# Patient Record
Sex: Female | Born: 1965 | Hispanic: Yes | Marital: Married | Smoking: Never smoker
Health system: Southern US, Community
[De-identification: ages and names within clinical notes are randomized; demographics above are authoritative.]

## PROBLEM LIST (undated history)

## (undated) DIAGNOSIS — T8859XA Other complications of anesthesia, initial encounter: Secondary | ICD-10-CM

## (undated) DIAGNOSIS — K219 Gastro-esophageal reflux disease without esophagitis: Secondary | ICD-10-CM

## (undated) DIAGNOSIS — N83209 Unspecified ovarian cyst, unspecified side: Secondary | ICD-10-CM

## (undated) HISTORY — PX: TUBAL LIGATION: SHX77

## (undated) HISTORY — PX: OTHER SURGICAL HISTORY: SHX169

---

## 2014-04-20 ENCOUNTER — Ambulatory Visit: Payer: Self-pay | Admitting: Internal Medicine

## 2015-11-18 ENCOUNTER — Other Ambulatory Visit: Payer: Self-pay | Admitting: Internal Medicine

## 2015-11-18 DIAGNOSIS — Z1231 Encounter for screening mammogram for malignant neoplasm of breast: Secondary | ICD-10-CM

## 2015-11-27 ENCOUNTER — Emergency Department: Payer: BLUE CROSS/BLUE SHIELD

## 2015-11-27 ENCOUNTER — Encounter: Payer: Self-pay | Admitting: Emergency Medicine

## 2015-11-27 ENCOUNTER — Emergency Department
Admission: EM | Admit: 2015-11-27 | Discharge: 2015-11-27 | Disposition: A | Discharge status: Home / Self Care | Payer: BLUE CROSS/BLUE SHIELD | Attending: Student | Admitting: Student

## 2015-11-27 DIAGNOSIS — R58 Hemorrhage, not elsewhere classified: Secondary | ICD-10-CM

## 2015-11-27 DIAGNOSIS — N390 Urinary tract infection, site not specified: Secondary | ICD-10-CM | POA: Diagnosis not present

## 2015-11-27 DIAGNOSIS — N939 Abnormal uterine and vaginal bleeding, unspecified: Secondary | ICD-10-CM | POA: Diagnosis not present

## 2015-11-27 DIAGNOSIS — R1030 Lower abdominal pain, unspecified: Secondary | ICD-10-CM | POA: Diagnosis present

## 2015-11-27 HISTORY — DX: Unspecified ovarian cyst, unspecified side: N83.209

## 2015-11-27 LAB — CBC WITH DIFFERENTIAL/PLATELET
Basophils Absolute: 0 10*3/uL (ref 0–0.1)
Basophils Relative: 1 %
Eosinophils Absolute: 0.1 10*3/uL (ref 0–0.7)
Eosinophils Relative: 2 %
HEMATOCRIT: 40.3 % (ref 35.0–47.0)
HEMOGLOBIN: 14.2 g/dL (ref 12.0–16.0)
LYMPHS ABS: 1.1 10*3/uL (ref 1.0–3.6)
LYMPHS PCT: 25 %
MCH: 30.8 pg (ref 26.0–34.0)
MCHC: 35.1 g/dL (ref 32.0–36.0)
MCV: 87.6 fL (ref 80.0–100.0)
MONO ABS: 0.3 10*3/uL (ref 0.2–0.9)
MONOS PCT: 6 %
NEUTROS ABS: 2.9 10*3/uL (ref 1.4–6.5)
NEUTROS PCT: 68 %
Platelets: 293 10*3/uL (ref 150–440)
RBC: 4.6 MIL/uL (ref 3.80–5.20)
RDW: 13.4 % (ref 11.5–14.5)
WBC: 4.3 10*3/uL (ref 3.6–11.0)

## 2015-11-27 LAB — COMPREHENSIVE METABOLIC PANEL
ALT: 19 U/L (ref 14–54)
ANION GAP: 6 (ref 5–15)
AST: 22 U/L (ref 15–41)
Albumin: 4 g/dL (ref 3.5–5.0)
Alkaline Phosphatase: 80 U/L (ref 38–126)
BUN: 10 mg/dL (ref 6–20)
CALCIUM: 8.9 mg/dL (ref 8.9–10.3)
CHLORIDE: 105 mmol/L (ref 101–111)
CO2: 26 mmol/L (ref 22–32)
Creatinine, Ser: 0.64 mg/dL (ref 0.44–1.00)
GFR calc non Af Amer: >60 mL/min (ref >60)
Glucose, Bld: 104 mg/dL — ABNORMAL HIGH (ref 65–99)
Potassium: 3.7 mmol/L (ref 3.5–5.1)
SODIUM: 137 mmol/L (ref 135–145)
Total Bilirubin: 0.4 mg/dL (ref 0.3–1.2)
Total Protein: 7.4 g/dL (ref 6.5–8.1)

## 2015-11-27 LAB — URINALYSIS COMPLETE WITH MICROSCOPIC (ARMC ONLY)
BACTERIA UA: NONE SEEN
Specific Gravity, Urine: 1.02 (ref 1.005–1.030)

## 2015-11-27 LAB — WET PREP, GENITAL
CLUE CELLS WET PREP: NONE SEEN
SPERM: NONE SEEN
TRICH WET PREP: NONE SEEN
Yeast Wet Prep HPF POC: NONE SEEN

## 2015-11-27 LAB — POCT PREGNANCY, URINE: PREG TEST UR: NEGATIVE

## 2015-11-27 MED ORDER — NITROFURANTOIN MONOHYD MACRO 100 MG PO CAPS: 100.0000 mg | ORAL_CAPSULE | Freq: Two times a day (BID) | ORAL | 0 refills | Status: AC

## 2015-11-27 MED ORDER — NITROFURANTOIN MONOHYD MACRO 100 MG PO CAPS
100.0000 mg | ORAL_CAPSULE | Freq: Once | ORAL | Status: AC
  Administered 2015-11-27: 100 mg via ORAL
  Filled 2015-11-27: qty 1

## 2015-11-27 NOTE — ED Notes (Signed)
Pt states that she started with bleeding yesterday, states that she had not had a menstral cycle since march of 2016, states some lower abd discomfort and also states some difficulty and pain with urination, pt states that she is uncertain if the blood is coming from the vagina or bladder. 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 States that she takes vitamins and b12 due to anemia, no distress noted, pt resting comfortably and given a warm blanket, pt oriented to the call bell and when to use, pt's family at bedside, cont to monitor

## 2015-11-27 NOTE — ED Provider Notes (Signed)
[facility] Emergency Department Provider Note   ____________________________________________   First MD Initiated Contact with Patient 11/27/15 [phone]     (approximate)  I have reviewed the triage vital signs and the nursing notes.   HISTORY  Chief Complaint Vaginal Bleeding  Caveat - language barrier overcome with use of the Spanish interpreter  HPI Renee Benson is a 50 y.o. 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 Patient reports that this feels like a typical menstrual. However she has not had a menstrual cycle since March 2016. She last had intercourse 4 days ago but reports she did not have bleeding until yesterday. She Is having some dysuria. No vomiting, diarrhea, fevers or chills. She is having some lower abdominal cramping which feels like a menstrual cramp. She denies chest pain or difficulty breathing.   Past Medical History:  Diagnosis Date  . Ovarian cyst     There are no active problems to display for this patient.   Past Surgical History:  Procedure Laterality Date  . nephrectomy      Prior to Admission medications   Medication Sig Start Date End Date Taking? Authorizing Provider  nitrofurantoin, macrocrystal-monohydrate, (MACROBID) 100 MG capsule Take 1 capsule (100 mg total) by mouth 2 (two) times daily. 11/27/15 11/30/15  Gayla Doss, MD    Allergies Review of patient's allergies indicates no known allergies.  History reviewed. No pertinent family history.  Social History Social History  Substance Use Topics  . Smoking status: Never Smoker  . Smokeless tobacco: Never Used  . Alcohol use No    Review of Systems Constitutional: No fever/chills Eyes: No visual changes. ENT: No sore throat. Cardiovascular: Denies chest pain. Respiratory:  Denies shortness of breath. Gastrointestinal: +lower abdominal cramping.  No nausea, no vomiting.  No diarrhea.  No constipation. Genitourinary: Negative for dysuria. Musculoskeletal: Negative for back pain. Skin: Negative for rash. Neurological: Negative for headaches, focal weakness or numbness.  10-point ROS otherwise negative.  ____________________________________________   PHYSICAL EXAM:   VITAL SIGNS: ED Triage Vitals  Enc Vitals Group     BP 11/27/15 0650 133/77     Pulse Rate 11/27/15 0650 66     Resp 11/27/15 0650 16     Temp 11/27/15 0650 98.1 F (36.7 C)     Temp Source 11/27/15 0650 Oral     SpO2 11/27/15 0650 100 %     Weight 11/27/15 0651 167 lb 8.8 oz (76 kg)     Height 11/27/15 0651 5' (1.524 m)     Head Circumference --      Peak Flow --      Pain Score 11/27/15 0652 4     Pain Loc --      Pain Edu? --      Excl. in GC? --     Constitutional: Alert and oriented. Well appearing and in no acute distress. Eyes: Conjunctivae are normal. PERRL. EOMI. Head: Atraumatic. Nose: No congestion/rhinnorhea. Mouth/Throat: Mucous membranes are moist.  Oropharynx non-erythematous. Neck: No stridor.  Cardiovascular: Normal rate, regular rhythm. Grossly normal heart sounds.  Good peripheral circulation. Respiratory: Normal respiratory effort.  No retractions. Lungs CTAB. Gastrointestinal: Soft and nontender. No distention. No CVA tenderness. Pelvic: Small amount of light vaginal bleeding from closed os, no vaginal laceration. Musculoskeletal: No lower extremity tenderness nor edema.  No joint  effusions. Neurologic:  Normal speech and language. No gross focal neurologic deficits are appreciated. No gait instability. Skin:  Skin is warm, dry and intact. No rash noted. Psychiatric: Mood and affect are normal. Speech and behavior are normal.  ____________________________________________   LABS (all labs ordered are listed, but only abnormal results are  displayed)  Labs Reviewed  WET PREP, GENITAL - Abnormal; Notable for the following:       Result Value   WBC, Wet Prep HPF POC FEW (*)    All other components within normal limits  URINALYSIS COMPLETEWITH MICROSCOPIC ([facility] ONLY) - Abnormal; Notable for the following:    Color, Urine RED (*)    APPearance CLOUDY (*)    Glucose, UA   (*)    Value: TEST NOT REPORTED DUE TO COLOR INTERFERENCE OF URINE PIGMENT   Bilirubin Urine   (*)    Value: TEST NOT REPORTED DUE TO COLOR INTERFERENCE OF URINE PIGMENT   Ketones, ur   (*)    Value: TEST NOT REPORTED DUE TO COLOR INTERFERENCE OF URINE PIGMENT   Hgb urine dipstick   (*)    Value: TEST NOT REPORTED DUE TO COLOR INTERFERENCE OF URINE PIGMENT   Protein, ur   (*)    Value: TEST NOT REPORTED DUE TO COLOR INTERFERENCE OF URINE PIGMENT   Nitrite   (*)    Value: TEST NOT REPORTED DUE TO COLOR INTERFERENCE OF URINE PIGMENT   Leukocytes, UA   (*)    Value: TEST NOT REPORTED DUE TO COLOR INTERFERENCE OF URINE PIGMENT   Squamous Epithelial / LPF 6-30 (*)    All other components within normal limits  COMPREHENSIVE METABOLIC PANEL - Abnormal; Notable for the following:    Glucose, Bld 104 (*)    All other components within normal limits  URINE CULTURE  CBC WITH DIFFERENTIAL/PLATELET  POCT PREGNANCY, URINE   ____________________________________________  EKG  none ____________________________________________  RADIOLOGY  Pelvic ultrasound IMPRESSION:  Endometrium is mildly thickened measuring 7 mm. In the setting of  post-menopausal bleeding, endometrial sampling is indicated to  exclude carcinoma. If results are benign, sonohysterogram should be  considered for focal lesion work-up. (Ref: Radiological Reasoning:  Algorithmic Workup of Abnormal Vaginal Bleeding with Endovaginal  Sonography and Sonohysterography. AJR 2008; 478:G95-62)    1.8 cm cyst within the left ovary. Recommend follow-up ultrasound in  12 months to assess for  resolution or stability.      ____________________________________________   PROCEDURES  Procedure(s) performed: None  Procedures  Critical Care performed: No  ____________________________________________   INITIAL IMPRESSION / ASSESSMENT AND PLAN / ED COURSE  Pertinent labs & imaging results that were available during my care of the patient were reviewed by me and considered in my medical decision making (see chart for details).  Juliah Scadden is a 50 y.o. 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 Since March 2016. On exam she is very well-appearing and in no acute distress. Vital signs stable, she is afebrile. She has a benign abdominal examination. Very small amount of light bleeding from a closed os, no vaginal laceration. We'll obtain screening labs as well as transitional ultrasound to evaluate the endometrial stripe though I discussed with her that she will require follow-up with OB/GYN expediently for additional testing which should include biopsy to  rule out cancer.  ----------------------------------------- 11:56 AM on 11/27/2015 ----------------------------------------- Patient continues to appear well. Labs are generally reassuring, unremarkable CBC and CMP. Negative pregnancy test. Negative wet prep. Urinalysis with 6-30 white blood cells but too numerous to count red blood cells which I suspect is contaminant from her vaginal bleeding. Culture is pending however given her complaint of dysuria, we will start Macrobid for possible UTI. I discussed with her with the  Spanish interpreter that she needs to have follow-up with an OB/GYN doctor in the next 2-3 days for repeat evaluation and for scheduled biopsy given ultrasound showing slightly thickened endometrial stripe. We  discussed return precautions and need for close follow-up and she is comfortable with the discharge plan. All questions answered to her satisfaction with the assistance of the [place] interpreter, Orson SlickJacqui.   Clinical Course     ____________________________________________   FINAL CLINICAL IMPRESSION(S) / ED DIAGNOSES  Final diagnoses:  Bleeding  Vaginal bleeding  UTI (lower urinary tract infection)      NEW MEDICATIONS STARTED DURING THIS VISIT:  Discharge Medication List as of 11/27/2015 12:00 PM    START taking these medications   Details  nitrofurantoin, macrocrystal-monohydrate, (MACROBID) 100 MG capsule Take 1 capsule (100 mg total) by mouth 2 (two) times daily., Starting Sat 11/27/2015, Until Tue 11/30/2015, Print         Note:  This document was prepared using Dragon voice recognition software and may include unintentional dictation errors.    Gayla DossEryka A Lyndel Sarate, MD 11/27/15 [phone]

## 2015-11-27 NOTE — ED Notes (Signed)
Pelvic exam performed by Dr Inocencio HomesGayle without difficulty, specimens obtained and sent to lab, pt tolerated well

## 2015-11-27 NOTE — ED Notes (Signed)
Pt changed into a gown, up to obtain clean catch urine. Report to david, rn.

## 2015-11-27 NOTE — ED Triage Notes (Signed)
Pt states vaginal bleeding, light in nature that began yesterday. Pt states she feels like she has menstral cramps. Pt states she last menstrated march 2016. Pt would like bleeding assessed. Pt states she also has pain with urination.

## 2015-11-28 LAB — URINE CULTURE

## 2015-11-29 NOTE — Progress Notes (Signed)
CONSULT NOTE - INITIAL  Pharmacy Consult for ED Culture Results   No Known Allergies  Patient Measurements: Height: 5' (152.4 cm) Weight: 167 lb 8.8 oz (76 kg) IBW/kg (Calculated) : 45.5  Recent Labs  11/27/15 0658  WBC 4.3  HGB 14.2  PLT 293  CREATININE 0.64   Estimated Creatinine Clearance: 75.8 mL/min (by C-G formula based on SCr of 0.8 mg/dL). No results for input(s): VANCOTROUGH, VANCOPEAK, VANCORANDOM, GENTTROUGH, GENTPEAK, GENTRANDOM, TOBRATROUGH, TOBRAPEAK, TOBRARND, AMIKACINPEAK, AMIKACINTROU, AMIKACIN in the last 72 hours.   Microbiology: Recent Results (from the past 720 hour(s))  Urine culture     Status: Abnormal   Collection Time: 11/27/15  6:58 AM  Result Value Ref Range Status   Specimen Description URINE, CLEAN CATCH  Final   Special Requests NONE  Final   Culture (A)  Final    >=100,000 COLONIES/mL STREPTOCOCCUS AGALACTIAE TESTING AGAINST S. AGALACTIAE NOT ROUTINELY PERFORMED DUE TO PREDICTABILITY OF AMP/PEN/VAN SUSCEPTIBILITY. Performed at [facility]    Report Status 11/28/2015 FINAL  Final  Wet prep, genital     Status: Abnormal   Collection Time: 11/27/15  7:48 AM  Result Value Ref Range Status   Yeast Wet Prep HPF POC NONE SEEN NONE SEEN Final   Trich, Wet Prep NONE SEEN NONE SEEN Final   Clue Cells Wet Prep HPF POC NONE SEEN NONE SEEN Final   WBC, Wet Prep HPF POC FEW (A) NONE SEEN Final   Sperm NONE SEEN  Final    Medical History: Past Medical History:  Diagnosis Date  . Ovarian cyst    Assessment: 50 yo female seen in ED for vaginal bleeding and dysuria. UA show no bacteria, 6-30 squamous epithelial cells, and 6-30 WBC.   Plan:  UA appears contaminated. UCx shows >100,000 Strep agalactiae.  Patient's PCP  ([facility]) was contacted on 9/11 @ 1415. Culture results were faxed over.  PCP  to f/u with patient.   Cher NakaiSheema Emric Kowalewski, PharmD Clinical Pharmacist 11/29/2015 2:26 PM

## 2015-12-16 ENCOUNTER — Ambulatory Visit: Payer: Self-pay | Attending: Internal Medicine

## 2016-06-08 ENCOUNTER — Ambulatory Visit: Payer: BLUE CROSS/BLUE SHIELD

## 2016-06-29 ENCOUNTER — Ambulatory Visit: Payer: BLUE CROSS/BLUE SHIELD | Attending: Internal Medicine

## 2016-11-16 ENCOUNTER — Other Ambulatory Visit: Payer: Self-pay | Admitting: Internal Medicine

## 2016-11-16 DIAGNOSIS — Z1231 Encounter for screening mammogram for malignant neoplasm of breast: Secondary | ICD-10-CM

## 2016-12-13 ENCOUNTER — Ambulatory Visit
Admission: RE | Admit: 2016-12-13 | Discharge: 2016-12-13 | Disposition: A | Discharge status: Home / Self Care | Payer: BLUE CROSS/BLUE SHIELD | Source: Ambulatory Visit | Attending: Internal Medicine | Admitting: Internal Medicine

## 2016-12-13 DIAGNOSIS — Z1231 Encounter for screening mammogram for malignant neoplasm of breast: Secondary | ICD-10-CM | POA: Insufficient documentation

## 2016-12-13 DIAGNOSIS — R921 Mammographic calcification found on diagnostic imaging of breast: Secondary | ICD-10-CM | POA: Diagnosis not present

## 2016-12-15 ENCOUNTER — Other Ambulatory Visit: Payer: Self-pay | Admitting: Family Medicine

## 2016-12-15 DIAGNOSIS — N6310 Unspecified lump in the right breast, unspecified quadrant: Secondary | ICD-10-CM

## 2016-12-15 DIAGNOSIS — R928 Other abnormal and inconclusive findings on diagnostic imaging of breast: Secondary | ICD-10-CM

## 2016-12-18 ENCOUNTER — Other Ambulatory Visit: Payer: Self-pay | Admitting: Internal Medicine

## 2016-12-18 DIAGNOSIS — R928 Other abnormal and inconclusive findings on diagnostic imaging of breast: Secondary | ICD-10-CM

## 2016-12-18 DIAGNOSIS — R921 Mammographic calcification found on diagnostic imaging of breast: Secondary | ICD-10-CM

## 2016-12-28 ENCOUNTER — Ambulatory Visit
Admission: RE | Admit: 2016-12-28 | Discharge: 2016-12-28 | Disposition: A | Discharge status: Home / Self Care | Payer: BLUE CROSS/BLUE SHIELD | Source: Ambulatory Visit | Attending: Internal Medicine | Admitting: Internal Medicine

## 2016-12-28 DIAGNOSIS — R928 Other abnormal and inconclusive findings on diagnostic imaging of breast: Secondary | ICD-10-CM | POA: Insufficient documentation

## 2016-12-28 DIAGNOSIS — R921 Mammographic calcification found on diagnostic imaging of breast: Secondary | ICD-10-CM | POA: Insufficient documentation

## 2016-12-29 ENCOUNTER — Other Ambulatory Visit: Payer: Self-pay | Admitting: Internal Medicine

## 2016-12-29 DIAGNOSIS — R928 Other abnormal and inconclusive findings on diagnostic imaging of breast: Secondary | ICD-10-CM

## 2016-12-29 DIAGNOSIS — R921 Mammographic calcification found on diagnostic imaging of breast: Secondary | ICD-10-CM

## 2017-01-09 ENCOUNTER — Ambulatory Visit
Admission: RE | Admit: 2017-01-09 | Discharge: 2017-01-09 | Disposition: A | Discharge status: Home / Self Care | Payer: BLUE CROSS/BLUE SHIELD | Source: Ambulatory Visit | Attending: Internal Medicine | Admitting: Internal Medicine

## 2017-01-09 DIAGNOSIS — R921 Mammographic calcification found on diagnostic imaging of breast: Secondary | ICD-10-CM

## 2017-01-09 DIAGNOSIS — R928 Other abnormal and inconclusive findings on diagnostic imaging of breast: Secondary | ICD-10-CM

## 2017-01-09 HISTORY — PX: BREAST BIOPSY: SHX20

## 2017-01-10 LAB — SURGICAL PATHOLOGY

## 2017-01-22 ENCOUNTER — Other Ambulatory Visit: Payer: Self-pay | Admitting: Internal Medicine

## 2017-01-22 ENCOUNTER — Ambulatory Visit
Admission: RE | Admit: 2017-01-22 | Discharge: 2017-01-22 | Disposition: A | Discharge status: Home / Self Care | Payer: BLUE CROSS/BLUE SHIELD | Source: Ambulatory Visit | Attending: Internal Medicine | Admitting: Internal Medicine

## 2017-01-22 DIAGNOSIS — R928 Other abnormal and inconclusive findings on diagnostic imaging of breast: Secondary | ICD-10-CM | POA: Diagnosis present

## 2017-01-22 DIAGNOSIS — R921 Mammographic calcification found on diagnostic imaging of breast: Secondary | ICD-10-CM | POA: Insufficient documentation

## 2018-03-27 ENCOUNTER — Ambulatory Visit
Admission: RE | Admit: 2018-03-27 | Payer: BLUE CROSS/BLUE SHIELD | Source: Home / Self Care | Admitting: Internal Medicine

## 2018-03-27 ENCOUNTER — Encounter: Admission: RE | Payer: Self-pay | Source: Home / Self Care

## 2018-03-27 SURGERY — COLONOSCOPY WITH PROPOFOL
Anesthesia: General

## 2018-07-02 ENCOUNTER — Ambulatory Visit: Payer: Self-pay | Admitting: Family Medicine

## 2018-08-06 ENCOUNTER — Other Ambulatory Visit: Payer: Self-pay

## 2018-08-06 ENCOUNTER — Ambulatory Visit: Payer: Self-pay | Admitting: Family Medicine

## 2018-08-09 ENCOUNTER — Ambulatory Visit: Payer: Self-pay | Admitting: Family Medicine

## 2018-09-04 ENCOUNTER — Other Ambulatory Visit: Payer: Self-pay | Admitting: Internal Medicine

## 2018-09-04 DIAGNOSIS — Z1231 Encounter for screening mammogram for malignant neoplasm of breast: Secondary | ICD-10-CM

## 2019-01-16 ENCOUNTER — Ambulatory Visit
Admission: RE | Admit: 2019-01-16 | Discharge: 2019-01-16 | Disposition: A | Discharge status: Home / Self Care | Payer: BLUE CROSS/BLUE SHIELD | Source: Ambulatory Visit | Attending: Internal Medicine | Admitting: Internal Medicine

## 2019-01-16 DIAGNOSIS — Z1231 Encounter for screening mammogram for malignant neoplasm of breast: Secondary | ICD-10-CM | POA: Diagnosis not present

## 2019-10-20 ENCOUNTER — Ambulatory Visit: Admit: 2019-10-20 | Payer: BLUE CROSS/BLUE SHIELD | Admitting: Internal Medicine

## 2019-10-20 SURGERY — COLONOSCOPY WITH PROPOFOL
Anesthesia: General

## 2019-12-05 ENCOUNTER — Other Ambulatory Visit
Admission: RE | Admit: 2019-12-05 | Discharge: 2019-12-05 | Disposition: A | Discharge status: Home / Self Care | Payer: 59 | Source: Ambulatory Visit | Attending: Internal Medicine | Admitting: Internal Medicine

## 2019-12-05 ENCOUNTER — Other Ambulatory Visit: Payer: Self-pay

## 2019-12-05 ENCOUNTER — Encounter: Payer: Self-pay | Admitting: Internal Medicine

## 2019-12-05 DIAGNOSIS — Z20822 Contact with and (suspected) exposure to covid-19: Secondary | ICD-10-CM | POA: Diagnosis not present

## 2019-12-05 DIAGNOSIS — Z01812 Encounter for preprocedural laboratory examination: Secondary | ICD-10-CM | POA: Diagnosis present

## 2019-12-06 LAB — SARS CORONAVIRUS 2 (TAT 6-24 HRS): SARS Coronavirus 2: NEGATIVE

## 2019-12-08 ENCOUNTER — Encounter
Admission: RE | Disposition: A | Discharge status: Home / Self Care | Payer: Self-pay | Source: Home / Self Care | Attending: Internal Medicine

## 2019-12-08 ENCOUNTER — Ambulatory Visit: Payer: 59 | Admitting: Anesthesiology

## 2019-12-08 ENCOUNTER — Ambulatory Visit
Admission: RE | Admit: 2019-12-08 | Discharge: 2019-12-08 | Disposition: A | Discharge status: Home / Self Care | Payer: 59 | Attending: Internal Medicine | Admitting: Internal Medicine

## 2019-12-08 ENCOUNTER — Other Ambulatory Visit: Payer: Self-pay

## 2019-12-08 ENCOUNTER — Encounter: Payer: Self-pay | Admitting: Internal Medicine

## 2019-12-08 DIAGNOSIS — K219 Gastro-esophageal reflux disease without esophagitis: Secondary | ICD-10-CM | POA: Insufficient documentation

## 2019-12-08 DIAGNOSIS — K641 Second degree hemorrhoids: Secondary | ICD-10-CM | POA: Diagnosis not present

## 2019-12-08 DIAGNOSIS — Z1211 Encounter for screening for malignant neoplasm of colon: Secondary | ICD-10-CM | POA: Diagnosis present

## 2019-12-08 DIAGNOSIS — K5730 Diverticulosis of large intestine without perforation or abscess without bleeding: Secondary | ICD-10-CM | POA: Insufficient documentation

## 2019-12-08 HISTORY — DX: Other complications of anesthesia, initial encounter: T88.59XA

## 2019-12-08 HISTORY — DX: Gastro-esophageal reflux disease without esophagitis: K21.9

## 2019-12-08 HISTORY — PX: COLONOSCOPY WITH PROPOFOL: SHX5780

## 2019-12-08 SURGERY — COLONOSCOPY WITH PROPOFOL
Anesthesia: General

## 2019-12-08 MED ORDER — FENTANYL CITRATE (PF) 100 MCG/2ML IJ SOLN
INTRAMUSCULAR | Status: DC | PRN
Start: 2019-12-08 — End: 2019-12-08
  Administered 2019-12-08 (×2): 25 ug via INTRAVENOUS
  Administered 2019-12-08: 50 ug via INTRAVENOUS

## 2019-12-08 MED ORDER — PROPOFOL 500 MG/50ML IV EMUL
INTRAVENOUS | Status: DC | PRN
  Administered 2019-12-08: 50 ug/kg/min via INTRAVENOUS

## 2019-12-08 MED ORDER — FENTANYL CITRATE (PF) 100 MCG/2ML IJ SOLN
INTRAMUSCULAR | Status: AC
  Filled 2019-12-08: qty 2

## 2019-12-08 MED ORDER — MIDAZOLAM HCL 5 MG/5ML IJ SOLN
INTRAMUSCULAR | Status: DC | PRN
  Administered 2019-12-08: 2 mg via INTRAVENOUS

## 2019-12-08 MED ORDER — LIDOCAINE HCL (PF) 2 % IJ SOLN
INTRAMUSCULAR | Status: DC | PRN
  Administered 2019-12-08: 60 mg

## 2019-12-08 MED ORDER — SODIUM CHLORIDE 0.9 % IV SOLN: INTRAVENOUS | Status: DC

## 2019-12-08 MED ORDER — PROPOFOL 10 MG/ML IV BOLUS
INTRAVENOUS | Status: DC | PRN
  Administered 2019-12-08: 30 mg via INTRAVENOUS

## 2019-12-08 MED ORDER — MIDAZOLAM HCL 2 MG/2ML IJ SOLN
INTRAMUSCULAR | Status: AC
  Filled 2019-12-08: qty 2

## 2019-12-08 MED ORDER — LIDOCAINE HCL (PF) 2 % IJ SOLN
INTRAMUSCULAR | Status: AC
  Filled 2019-12-08: qty 5

## 2019-12-08 NOTE — H&P (Signed)
Outpatient short stay form Pre-procedure 12/08/2019 12:31 PM Renee Benson Renee Benson, M.D.  Primary Physician: Renee Benson, M.D.  Reason for visit:  Colon cancer screening  History of present illness:  Patient presents for colonoscopy for colon cancer screening. The patient denies complaints of abdominal pain, significant change in bowel habits, or rectal bleeding.      Current Facility-Administered Medications:  .  0.9 %  sodium chloride infusion, , Intravenous, Continuous, Renee Benson, Renee Nearing, MD, Last Rate: 20 mL/hr at 12/08/19 1055, New Bag at 12/08/19 1055  Medications Prior to Admission  Medication Sig Dispense Refill Last Dose  . Cholecalciferol 50 MCG (2000 UT) TABS Take 1 tablet by mouth daily.     . vitamin B-12 (CYANOCOBALAMIN) 1000 MCG tablet Take 1,000 mcg by mouth daily.        No Known Allergies   Past Medical History:  Diagnosis Date  . Complication of anesthesia    sleep walk after anesthesia  . GERD (gastroesophageal reflux disease)   . Ovarian cyst     Review of systems:  Otherwise negative.    Physical Exam  Gen: Alert, oriented. Appears stated age.  HEENT: Renee Benson/AT. PERRLA. Lungs: CTA, no wheezes. CV: RR nl S1, S2. Abd: soft, benign, no masses. BS+ Ext: No edema. Pulses 2+    Planned procedures: Proceed with colonoscopy. The patient understands the nature of the planned procedure, indications, risks, alternatives and potential complications including but not limited to bleeding, infection, perforation, damage to internal organs and possible oversedation/side effects from anesthesia. The patient agrees and gives consent to proceed.  Please refer to procedure notes for findings, recommendations and patient disposition/instructions.     Renee Benson Renee Benson, M.D. Gastroenterology 12/08/2019  12:31 PM

## 2019-12-08 NOTE — Anesthesia Preprocedure Evaluation (Addendum)
Anesthesia Evaluation  Patient identified by MRN, date of birth, ID band Patient awake    Reviewed: Allergy & Precautions, NPO status , Patient's Chart, lab work & pertinent test results  History of Anesthesia Complications Negative for: history of anesthetic complications  Airway Mallampati: II  TM Distance: >3 FB Neck ROM: Full    Dental no notable dental hx. (+) Teeth Intact   Pulmonary neg pulmonary ROS, neg sleep apnea, neg COPD, Patient abstained from smoking.Not current smoker,    Pulmonary exam normal breath sounds clear to auscultation       Cardiovascular Exercise Tolerance: Good METS(-) hypertension(-) CAD and (-) Past MI negative cardio ROS  (-) dysrhythmias  Rhythm:Regular Rate:Normal - Systolic murmurs    Neuro/Psych negative neurological ROS  negative psych ROS   GI/Hepatic GERD  ,(+)     (-) substance abuse  ,   Endo/Other  neg diabetes  Renal/GU One kidney (donated to son)     Musculoskeletal   Abdominal   Peds  Hematology   Anesthesia Other Findings Past Medical History: No date: Complication of anesthesia     Comment:  sleep walk after anesthesia No date: GERD (gastroesophageal reflux disease) No date: Ovarian cyst  Reproductive/Obstetrics                             Anesthesia Physical Anesthesia Plan  ASA: II  Anesthesia Plan: General   Post-op Pain Management:    Induction: Intravenous  PONV Risk Score and Plan: 3 and Ondansetron, Propofol infusion and TIVA  Airway Management Planned: Nasal Cannula  Additional Equipment: None  Intra-op Plan:   Post-operative Plan:   Informed Consent: I have reviewed the patients History and Physical, chart, labs and discussed the procedure including the risks, benefits and alternatives for the proposed anesthesia with the patient or authorized representative who has indicated his/her understanding and acceptance.      Dental advisory given and Interpreter used for interveiw (in person spanish interpreter)  Plan Discussed with: CRNA and Surgeon  Anesthesia Plan Comments: (Discussed risks of anesthesia with patient, including possibility of difficulty with spontaneous ventilation under anesthesia necessitating airway intervention, PONV, and rare risks such as cardiac or respiratory or neurological events. Patient understands.)       Anesthesia Quick Evaluation

## 2019-12-08 NOTE — Interval H&P Note (Signed)
History and Physical Interval Note:  12/08/2019 12:32 PM  Renee Benson  has presented today for surgery, with the diagnosis of SCREENING.  The various methods of treatment have been discussed with the patient and family. After consideration of risks, benefits and other options for treatment, the patient has consented to  Procedure(s): COLONOSCOPY WITH PROPOFOL (N/A) as a surgical intervention.  The patient's history has been reviewed, patient examined, no change in status, stable for surgery.  I have reviewed the patient's chart and labs.  Questions were answered to the patient's satisfaction.     [place], [place]

## 2019-12-08 NOTE — Op Note (Signed)
[facility] Gastroenterology Patient Name: Renee Benson Procedure Date: 12/08/2019 12:33 PM MRN: [id number] Account #: [id number] Date of Birth: 1966-01-15 Admit Type: Outpatient Age: 54 Room: [facility] ENDO ROOM 3 Gender: Female Note Status: Finalized Procedure:             Colonoscopy Indications:           Screening for colorectal malignant neoplasm Providers:             Boykin Nearing. Norma Fredrickson MD, MD Referring MD:          Barbette Reichmann, MD (Referring MD) Medicines:             Propofol per Anesthesia Complications:         No immediate complications. Procedure:             Pre-Anesthesia Assessment:                        - The risks and benefits of the procedure and the                         sedation options and risks were discussed with the                         patient. All questions were answered and informed                         consent was obtained.                        - Patient identification and proposed procedure were                         verified prior to the procedure by the nurse. The                         procedure was verified in the procedure room.                        - ASA Grade Assessment: II - A patient with mild                         systemic disease.                        - After reviewing the risks and benefits, the patient                         was deemed in satisfactory condition to undergo the                         procedure.                        After obtaining informed consent, the colonoscope was                         passed under direct vision. Throughout the procedure,                         the patient's blood  pressure, pulse, and oxygen                         saturations were monitored continuously. The                         Colonoscope was introduced through the anus and                         advanced to the the cecum, identified by appendiceal                         orifice and  ileocecal valve. The colonoscopy was                         performed without difficulty. The patient tolerated                         the procedure well. The quality of the bowel                         preparation was good. The ileocecal valve, appendiceal                         orifice, and rectum were photographed. Findings:      The perianal and digital rectal examinations were normal. Pertinent       negatives include normal sphincter tone and no palpable rectal lesions.      Non-bleeding internal hemorrhoids were found during retroflexion. The       hemorrhoids were Grade II (internal hemorrhoids that prolapse but reduce       spontaneously).      Many small-mouthed diverticula were found in the left colon.      The exam was otherwise without abnormality. Impression:            - Non-bleeding internal hemorrhoids.                        - Diverticulosis in the left colon.                        - The examination was otherwise normal.                        - No specimens collected. Recommendation:        - Patient has a contact number available for                         emergencies. The signs and symptoms of potential                         delayed complications were discussed with the patient.                         Return to normal activities tomorrow. Written                         discharge instructions were provided to the patient.                        -  Resume previous diet.                        - Continue present medications.                        - Repeat colonoscopy in 10 years for screening                         purposes.                        - Return to GI office PRN.                        - The findings and recommendations were discussed with                         the patient. Procedure Code(s):     --- Professional ---                        J3354, Colorectal cancer screening; colonoscopy on                         individual not meeting criteria  for high risk Diagnosis Code(s):     --- Professional ---                        K57.30, Diverticulosis of large intestine without                         perforation or abscess without bleeding                        K64.1, Second degree hemorrhoids                        Z12.11, Encounter for screening for malignant neoplasm                         of colon CPT copyright 2019 American Medical Association. All rights reserved. The codes documented in this report are preliminary and upon coder review may  be revised to meet current compliance requirements. Stanton Kidney MD, MD 12/08/2019 1:02:21 PM This report has been signed electronically. Number of Addenda: 0 Note Initiated On: 12/08/2019 12:33 PM Scope Withdrawal Time: 0 hours 4 minutes 49 seconds  Total Procedure Duration: 0 hours 11 minutes 22 seconds  Estimated Blood Loss:  Estimated blood loss: none.      [facility]

## 2019-12-08 NOTE — Anesthesia Postprocedure Evaluation (Signed)
Anesthesia Post Note  Patient: Renee Benson  Procedure(s) Performed: COLONOSCOPY WITH PROPOFOL (N/A )  Patient location during evaluation: Endoscopy Anesthesia Type: General Level of consciousness: awake and alert Pain management: pain level controlled Vital Signs Assessment: post-procedure vital signs reviewed and stable Respiratory status: spontaneous breathing, nonlabored ventilation, respiratory function stable and patient connected to nasal cannula oxygen Cardiovascular status: blood pressure returned to baseline and stable Postop Assessment: no apparent nausea or vomiting Anesthetic complications: no   No complications documented.   Last Vitals:  Vitals:   12/08/19 1300 12/08/19 1320  BP:  126/86  Pulse:    Resp:    Temp: (!) 36 C   SpO2:      Last Pain:  Vitals:   12/08/19 1320  TempSrc:   PainSc: 0-No pain                 Corinda Gubler

## 2019-12-08 NOTE — Transfer of Care (Signed)
Immediate Anesthesia Transfer of Care Note  Patient: Renee Benson  Procedure(s) Performed: COLONOSCOPY WITH PROPOFOL (N/A )  Patient Location: PACU  Anesthesia Type:General  Level of Consciousness: sedated  Airway & Oxygen Therapy: Patient Spontanous Breathing and Patient connected to nasal cannula oxygen  Post-op Assessment: Report given to RN and Post -op Vital signs reviewed and stable  Post vital signs: Reviewed and stable  Last Vitals:  Vitals Value Taken Time  BP    Temp    Pulse 69 12/08/19 1300  Resp 13 12/08/19 1300  SpO2 100 % 12/08/19 1300  Vitals shown include unvalidated device data.  Last Pain:  Vitals:   12/08/19 1037  TempSrc: Tympanic  PainSc: 0-No pain         Complications: No complications documented.

## 2021-03-04 ENCOUNTER — Other Ambulatory Visit: Payer: Self-pay | Admitting: Internal Medicine

## 2021-03-04 DIAGNOSIS — E7800 Pure hypercholesterolemia, unspecified: Secondary | ICD-10-CM

## 2021-03-04 DIAGNOSIS — Z1231 Encounter for screening mammogram for malignant neoplasm of breast: Secondary | ICD-10-CM

## 2021-03-04 DIAGNOSIS — Z524 Kidney donor: Secondary | ICD-10-CM

## 2021-03-04 DIAGNOSIS — R7989 Other specified abnormal findings of blood chemistry: Secondary | ICD-10-CM

## 2021-03-04 DIAGNOSIS — Z6833 Body mass index (BMI) 33.0-33.9, adult: Secondary | ICD-10-CM

## 2021-04-01 ENCOUNTER — Ambulatory Visit: Admission: RE | Admit: 2021-04-01 | Payer: Self-pay | Source: Ambulatory Visit

## 2021-04-29 IMAGING — MG DIGITAL SCREENING BILAT W/ TOMO
8 series · 8 of 24 positions shown · non-contrast
Comparison: Previous exam(s).

CLINICAL DATA: Screening.

EXAM:
DIGITAL SCREENING BILATERAL MAMMOGRAM WITH TOMO AND CAD

[R CC synth-2D]
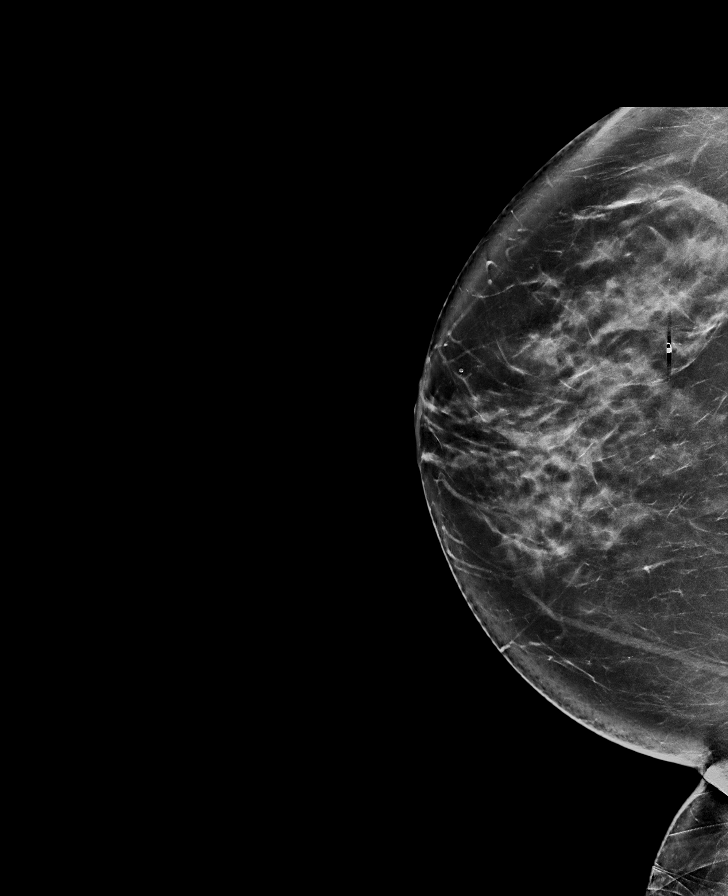

[R MLO synth-2D]
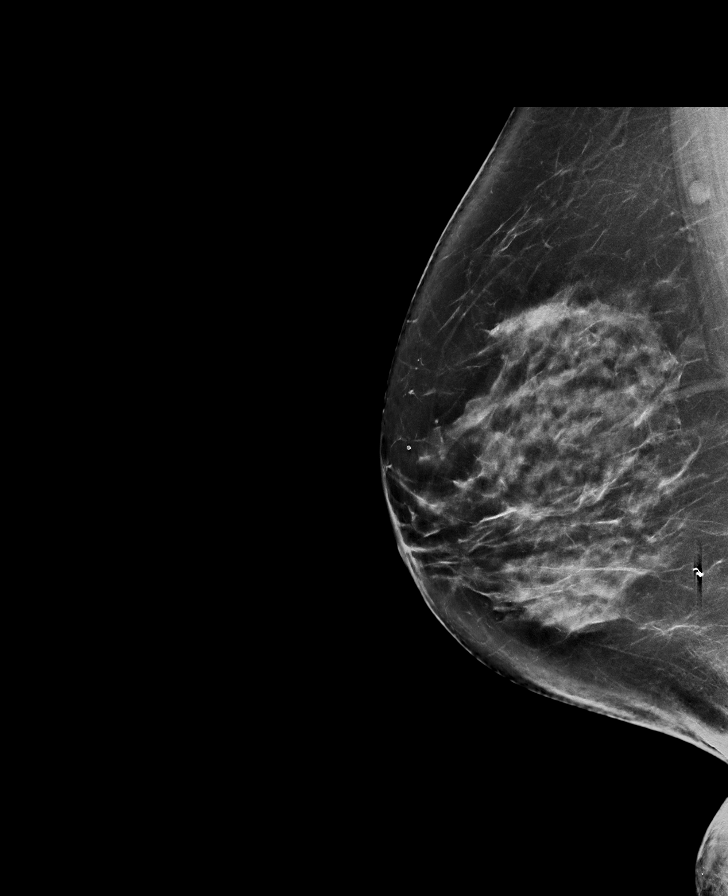

[L CC synth-2D]
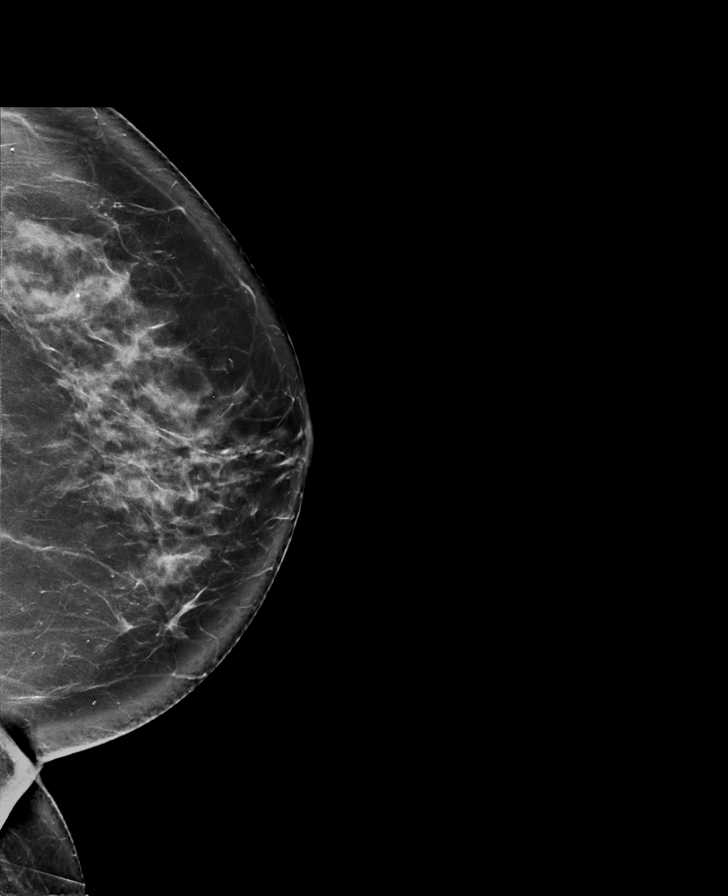

[L MLO synth-2D]
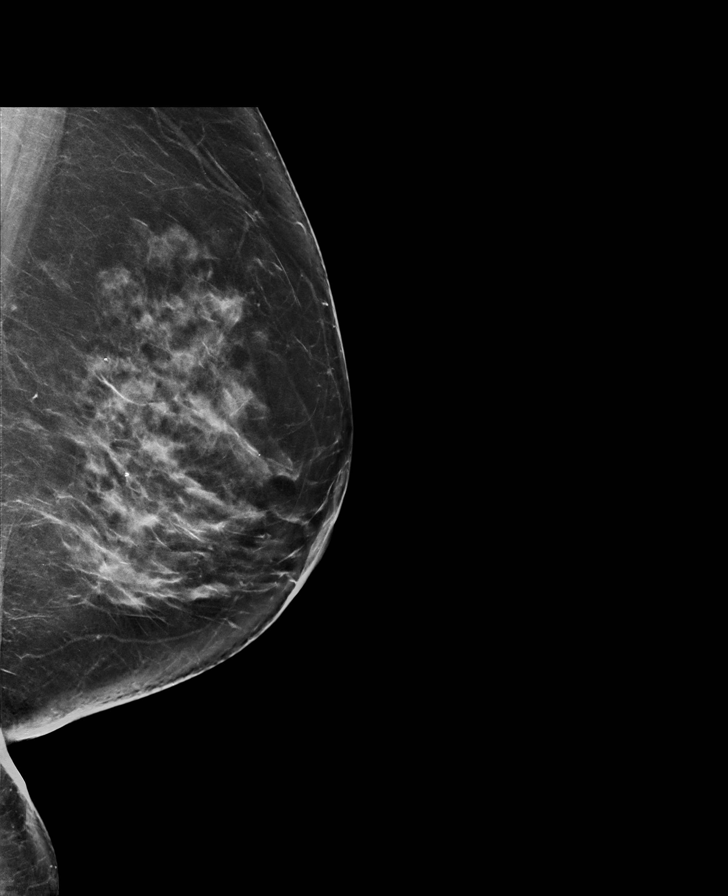

[L CC tomo · tomo slice 49/96.0]
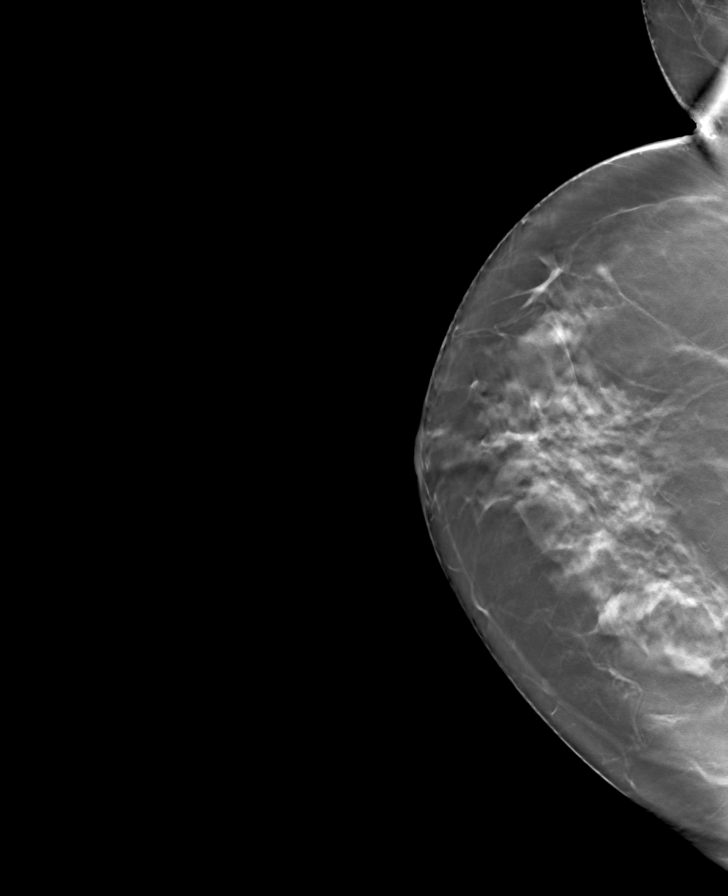

[R CC tomo · tomo slice 48/95.0]
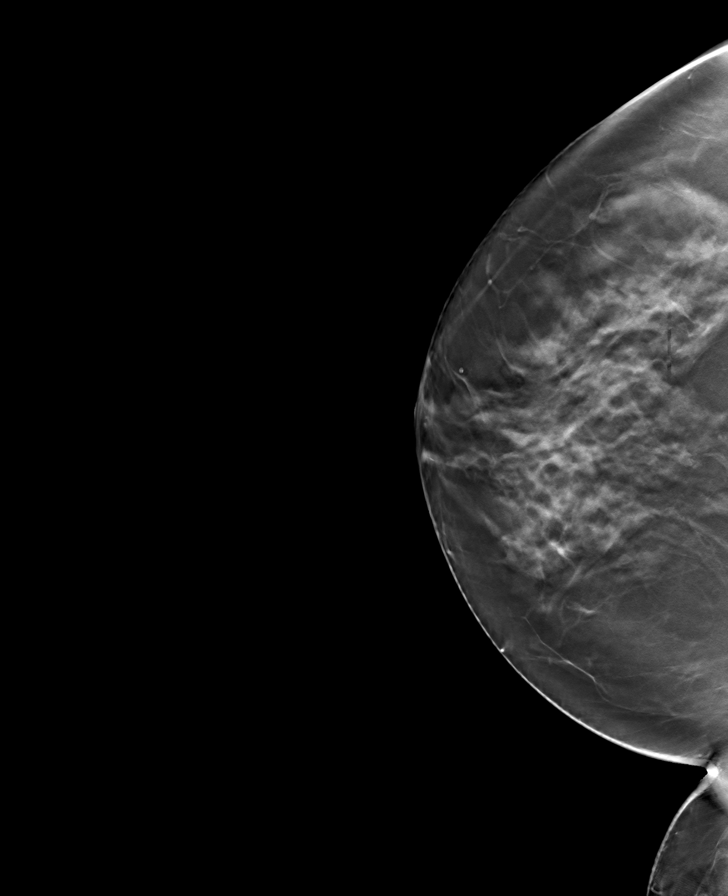

[R MLO tomo · tomo slice 47/94.0]
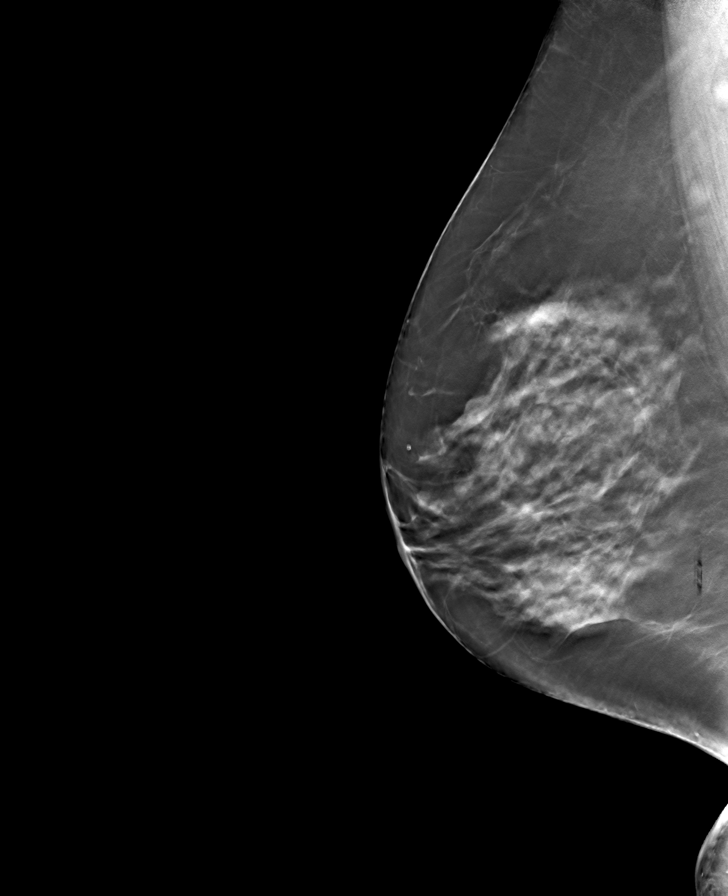

[L MLO tomo · tomo slice 47/92.0]
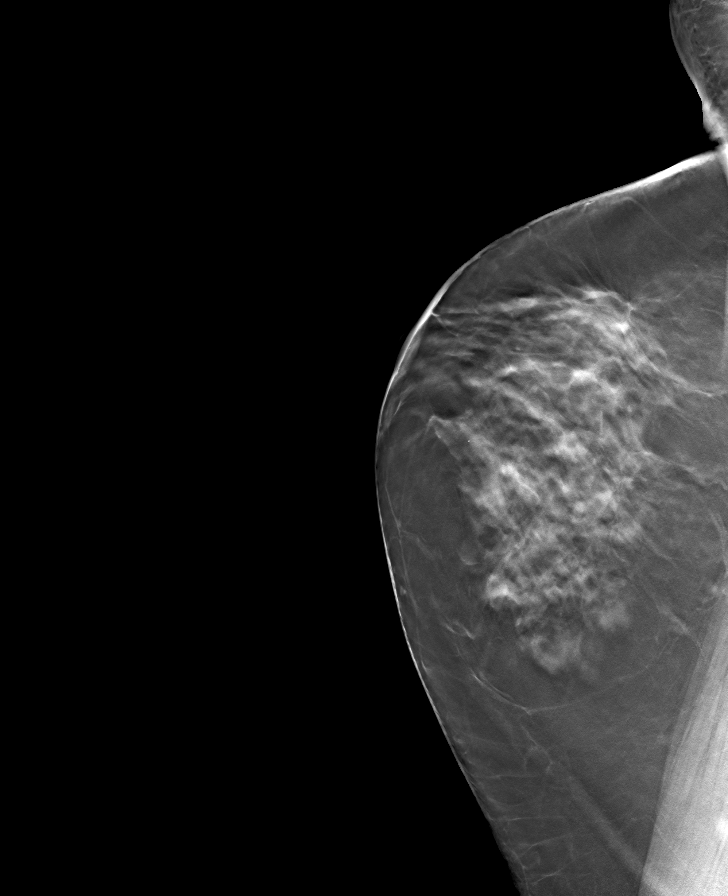

[8 of 24 positions shown; findings below may reference images not displayed]

ACR Breast Density Category c: The breast tissue is heterogeneously
dense, which may obscure small masses.
FINDINGS: There are no findings suspicious for malignancy. Images were
processed with CAD.
IMPRESSION: No mammographic evidence of malignancy. A result letter of this
screening mammogram will be mailed directly to the patient.

RECOMMENDATION:
Screening mammogram in one year. (Code:[id number])

BI-RADS CATEGORY  1: Negative.

## 2022-12-12 ENCOUNTER — Other Ambulatory Visit: Payer: Self-pay | Admitting: Internal Medicine

## 2022-12-12 DIAGNOSIS — Z1231 Encounter for screening mammogram for malignant neoplasm of breast: Secondary | ICD-10-CM
